# Patient Record
Sex: Male | Born: 2000 | Race: Black or African American | Hispanic: No | Marital: Single | Smoking: Never smoker
Health system: Southern US, Community
[De-identification: ages and names within clinical notes are randomized; demographics above are authoritative.]

---
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
## 2019-10-06 ENCOUNTER — Other Ambulatory Visit: Payer: Self-pay

## 2019-10-06 DIAGNOSIS — Z20822 Contact with and (suspected) exposure to covid-19: Secondary | ICD-10-CM

## 2019-10-08 LAB — NOVEL CORONAVIRUS, NAA: SARS-CoV-2, NAA: NOT DETECTED

## 2021-04-06 ENCOUNTER — Emergency Department: Payer: BC Managed Care – PPO

## 2021-04-06 ENCOUNTER — Emergency Department
Admission: EM | Admit: 2021-04-06 | Discharge: 2021-04-06 | Disposition: A | Discharge status: Home / Self Care | Payer: BC Managed Care – PPO | Attending: Emergency Medicine | Admitting: Emergency Medicine

## 2021-04-06 ENCOUNTER — Other Ambulatory Visit: Payer: Self-pay

## 2021-04-06 ENCOUNTER — Encounter: Payer: Self-pay | Admitting: Emergency Medicine

## 2021-04-06 DIAGNOSIS — S40012A Contusion of left shoulder, initial encounter: Secondary | ICD-10-CM | POA: Diagnosis not present

## 2021-04-06 DIAGNOSIS — T148XXA Other injury of unspecified body region, initial encounter: Secondary | ICD-10-CM

## 2021-04-06 DIAGNOSIS — S81811A Laceration without foreign body, right lower leg, initial encounter: Secondary | ICD-10-CM | POA: Diagnosis not present

## 2021-04-06 DIAGNOSIS — S8011XA Contusion of right lower leg, initial encounter: Secondary | ICD-10-CM

## 2021-04-06 DIAGNOSIS — Y92410 Unspecified street and highway as the place of occurrence of the external cause: Secondary | ICD-10-CM | POA: Insufficient documentation

## 2021-04-06 DIAGNOSIS — S8991XA Unspecified injury of right lower leg, initial encounter: Secondary | ICD-10-CM | POA: Diagnosis present

## 2021-04-06 DIAGNOSIS — S81801A Unspecified open wound, right lower leg, initial encounter: Secondary | ICD-10-CM

## 2021-04-06 MED ORDER — HYDROCODONE-ACETAMINOPHEN 5-325 MG PO TABS: 1.0000 | ORAL_TABLET | Freq: Four times a day (QID) | ORAL | 0 refills | Status: AC | PRN

## 2021-04-06 MED ORDER — LIDOCAINE HCL (PF) 1 % IJ SOLN: 5.0000 mL | Freq: Once | INTRAMUSCULAR | Status: DC

## 2021-04-06 MED ORDER — IBUPROFEN 600 MG PO TABS: 600.0000 mg | ORAL_TABLET | Freq: Four times a day (QID) | ORAL | 0 refills | Status: AC | PRN
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
## 2021-04-06 NOTE — ED Notes (Signed)
Wound cleansed with saline  Non stick dressing applied

## 2021-04-06 NOTE — Discharge Instructions (Addendum)
Follow-up with your primary care provider, urgent care or [facility] acute care.  Keep area clean and dry.  Watch for any signs of infection.  Clean daily with mild soap and water and allowed to dry completely before putting a dressing on it.  You may you apply ice to your left shoulder as needed for discomfort.  You can expect to be sore in places that do not bother you at this time and will remain sore for approximately 4 to 5 days even with medication.  Take medication sent to your pharmacy with food to avoid stomach upset.  Not drive or operate machinery while taking the hydrocodone as it is considered a narcotic and can cause drowsiness increasing your chance to have an injury.
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
